# Patient Record
Sex: Male | Born: 1950 | Race: White | Hispanic: No | Marital: Married
Health system: Southern US, Community
[De-identification: ages and names within clinical notes are randomized; demographics above are authoritative.]

---

## 2015-03-28 ENCOUNTER — Inpatient Hospital Stay (HOSPITAL_COMMUNITY): Payer: Medicare Other

## 2015-03-28 ENCOUNTER — Emergency Department (HOSPITAL_COMMUNITY): Payer: Medicare Other

## 2015-03-28 ENCOUNTER — Inpatient Hospital Stay (HOSPITAL_COMMUNITY)
Admission: EM | Admit: 2015-03-28 | Discharge: 2015-04-01 | DRG: 208 | Disposition: E | Payer: Medicare Other | Attending: Pulmonary Disease | Admitting: Pulmonary Disease

## 2015-03-28 ENCOUNTER — Encounter (HOSPITAL_COMMUNITY): Payer: Self-pay

## 2015-03-28 DIAGNOSIS — I462 Cardiac arrest due to underlying cardiac condition: Secondary | ICD-10-CM | POA: Diagnosis present

## 2015-03-28 DIAGNOSIS — S225XXA Flail chest, initial encounter for closed fracture: Secondary | ICD-10-CM | POA: Diagnosis present

## 2015-03-28 DIAGNOSIS — T17928A Food in respiratory tract, part unspecified causing other injury, initial encounter: Secondary | ICD-10-CM | POA: Diagnosis present

## 2015-03-28 DIAGNOSIS — R402212 Coma scale, best verbal response, none, at arrival to emergency department: Secondary | ICD-10-CM | POA: Diagnosis present

## 2015-03-28 DIAGNOSIS — R092 Respiratory arrest: Secondary | ICD-10-CM | POA: Insufficient documentation

## 2015-03-28 DIAGNOSIS — K7200 Acute and subacute hepatic failure without coma: Secondary | ICD-10-CM | POA: Diagnosis present

## 2015-03-28 DIAGNOSIS — E874 Mixed disorder of acid-base balance: Secondary | ICD-10-CM | POA: Diagnosis present

## 2015-03-28 DIAGNOSIS — I469 Cardiac arrest, cause unspecified: Secondary | ICD-10-CM | POA: Diagnosis not present

## 2015-03-28 DIAGNOSIS — T17920A Food in respiratory tract, part unspecified causing asphyxiation, initial encounter: Secondary | ICD-10-CM | POA: Diagnosis not present

## 2015-03-28 DIAGNOSIS — G253 Myoclonus: Secondary | ICD-10-CM | POA: Diagnosis present

## 2015-03-28 DIAGNOSIS — R739 Hyperglycemia, unspecified: Secondary | ICD-10-CM | POA: Diagnosis present

## 2015-03-28 DIAGNOSIS — R402312 Coma scale, best motor response, none, at arrival to emergency department: Secondary | ICD-10-CM | POA: Diagnosis present

## 2015-03-28 DIAGNOSIS — Z515 Encounter for palliative care: Secondary | ICD-10-CM | POA: Diagnosis not present

## 2015-03-28 DIAGNOSIS — J9600 Acute respiratory failure, unspecified whether with hypoxia or hypercapnia: Secondary | ICD-10-CM | POA: Diagnosis not present

## 2015-03-28 DIAGNOSIS — N179 Acute kidney failure, unspecified: Secondary | ICD-10-CM | POA: Diagnosis present

## 2015-03-28 DIAGNOSIS — Z66 Do not resuscitate: Secondary | ICD-10-CM | POA: Diagnosis present

## 2015-03-28 DIAGNOSIS — K759 Inflammatory liver disease, unspecified: Secondary | ICD-10-CM | POA: Insufficient documentation

## 2015-03-28 DIAGNOSIS — R402112 Coma scale, eyes open, never, at arrival to emergency department: Secondary | ICD-10-CM | POA: Diagnosis not present

## 2015-03-28 DIAGNOSIS — W44F3XA Food in larynx causing asphyxiation, initial encounter: Secondary | ICD-10-CM | POA: Insufficient documentation

## 2015-03-28 DIAGNOSIS — I252 Old myocardial infarction: Secondary | ICD-10-CM | POA: Diagnosis not present

## 2015-03-28 DIAGNOSIS — G9340 Encephalopathy, unspecified: Secondary | ICD-10-CM | POA: Insufficient documentation

## 2015-03-28 DIAGNOSIS — T17320A Food in larynx causing asphyxiation, initial encounter: Secondary | ICD-10-CM

## 2015-03-28 DIAGNOSIS — I4901 Ventricular fibrillation: Secondary | ICD-10-CM | POA: Diagnosis not present

## 2015-03-28 DIAGNOSIS — G931 Anoxic brain damage, not elsewhere classified: Secondary | ICD-10-CM | POA: Diagnosis present

## 2015-03-28 LAB — COMPREHENSIVE METABOLIC PANEL
ALK PHOS: 71 U/L (ref 38–126)
ALT: 340 U/L — AB (ref 17–63)
AST: 307 U/L — AB (ref 15–41)
Albumin: 3.1 g/dL — ABNORMAL LOW (ref 3.5–5.0)
Anion gap: 18 — ABNORMAL HIGH (ref 5–15)
BILIRUBIN TOTAL: 0.7 mg/dL (ref 0.3–1.2)
BUN: 17 mg/dL (ref 6–20)
CALCIUM: 8.3 mg/dL — AB (ref 8.9–10.3)
CO2: 16 mmol/L — ABNORMAL LOW (ref 22–32)
CREATININE: 1.65 mg/dL — AB (ref 0.61–1.24)
Chloride: 107 mmol/L (ref 101–111)
GFR calc Af Amer: 48 mL/min — ABNORMAL LOW (ref >60)
GFR, EST NON AFRICAN AMERICAN: 42 mL/min — AB (ref >60)
Glucose, Bld: 275 mg/dL — ABNORMAL HIGH (ref 65–99)
Potassium: 3.3 mmol/L — ABNORMAL LOW (ref 3.5–5.1)
Sodium: 141 mmol/L (ref 135–145)
TOTAL PROTEIN: 5.2 g/dL — AB (ref 6.5–8.1)

## 2015-03-28 LAB — I-STAT ARTERIAL BLOOD GAS, ED
Acid-base deficit: 19 mmol/L — ABNORMAL HIGH (ref 0.0–2.0)
BICARBONATE: 14.2 meq/L — AB (ref 20.0–24.0)
O2 Saturation: 100 %
PCO2 ART: 67.4 mmHg — AB (ref 35.0–45.0)
Patient temperature: 98.6
TCO2: 16 mmol/L (ref 0–100)
pH, Arterial: 6.931 — CL (ref 7.350–7.450)
pO2, Arterial: 404 mmHg — ABNORMAL HIGH (ref 80.0–100.0)

## 2015-03-28 LAB — CBC
HEMATOCRIT: 46.1 % (ref 39.0–52.0)
HEMOGLOBIN: 15.2 g/dL (ref 13.0–17.0)
MCH: 31.4 pg (ref 26.0–34.0)
MCHC: 33 g/dL (ref 30.0–36.0)
MCV: 95.2 fL (ref 78.0–100.0)
Platelets: 176 10*3/uL (ref 150–400)
RBC: 4.84 MIL/uL (ref 4.22–5.81)
RDW: 13.5 % (ref 11.5–15.5)
WBC: 14.8 10*3/uL — AB (ref 4.0–10.5)

## 2015-03-28 LAB — I-STAT CHEM 8, ED
BUN: 20 mg/dL (ref 6–20)
CALCIUM ION: 1.13 mmol/L (ref 1.13–1.30)
Chloride: 105 mmol/L (ref 101–111)
Creatinine, Ser: 1.5 mg/dL — ABNORMAL HIGH (ref 0.61–1.24)
GLUCOSE: 253 mg/dL — AB (ref 65–99)
HCT: 48 % (ref 39.0–52.0)
HEMOGLOBIN: 16.3 g/dL (ref 13.0–17.0)
POTASSIUM: 3.1 mmol/L — AB (ref 3.5–5.1)
SODIUM: 141 mmol/L (ref 135–145)
TCO2: 17 mmol/L (ref 0–100)

## 2015-03-28 LAB — I-STAT TROPONIN, ED: TROPONIN I, POC: 0.28 ng/mL — AB (ref 0.00–0.08)

## 2015-03-28 LAB — CBG MONITORING, ED: Glucose-Capillary: 226 mg/dL — ABNORMAL HIGH (ref 65–99)

## 2015-03-28 LAB — PROTIME-INR
INR: 1.63 — ABNORMAL HIGH (ref 0.00–1.49)
PROTHROMBIN TIME: 19.4 s — AB (ref 11.6–15.2)

## 2015-03-28 LAB — I-STAT CG4 LACTIC ACID, ED: Lactic Acid, Venous: 10.1 mmol/L (ref 0.5–2.0)

## 2015-03-28 LAB — APTT: aPTT: 53 seconds — ABNORMAL HIGH (ref 24–37)

## 2015-03-28 MED ORDER — FENTANYL BOLUS VIA INFUSION
25.0000 ug | INTRAVENOUS | Status: DC | PRN
  Filled 2015-03-28: qty 25

## 2015-03-28 MED ORDER — FENTANYL CITRATE (PF) 100 MCG/2ML IJ SOLN
50.0000 ug | Freq: Once | INTRAMUSCULAR | Status: AC
  Administered 2015-03-28: 50 ug via INTRAVENOUS

## 2015-03-28 MED ORDER — PROPOFOL 1000 MG/100ML IV EMUL
5.0000 ug/kg/min | INTRAVENOUS | Status: DC
  Administered 2015-03-28: 10 ug/kg/min via INTRAVENOUS
  Filled 2015-03-28 (×3): qty 100

## 2015-03-28 MED ORDER — LORAZEPAM 2 MG/ML IJ SOLN
INTRAMUSCULAR | Status: AC
  Filled 2015-03-28: qty 1

## 2015-03-28 MED ORDER — SODIUM CHLORIDE 0.9 % IV SOLN
1.0000 ug/kg/min | INTRAVENOUS | Status: DC
  Administered 2015-03-29: 1 ug/kg/min via INTRAVENOUS
  Filled 2015-03-28: qty 20

## 2015-03-28 MED ORDER — NOREPINEPHRINE BITARTRATE 1 MG/ML IV SOLN
0.0000 ug/min | INTRAVENOUS | Status: DC
  Administered 2015-03-29: 0 ug/min via INTRAVENOUS
  Filled 2015-03-28: qty 4

## 2015-03-28 MED ORDER — ARTIFICIAL TEARS OP OINT
1.0000 "application " | TOPICAL_OINTMENT | Freq: Three times a day (TID) | OPHTHALMIC | Status: DC
  Administered 2015-03-29 (×2): 1 via OPHTHALMIC
  Filled 2015-03-28: qty 3.5

## 2015-03-28 MED ORDER — FAMOTIDINE IN NACL 20-0.9 MG/50ML-% IV SOLN
20.0000 mg | Freq: Two times a day (BID) | INTRAVENOUS | Status: DC
  Administered 2015-03-29 (×2): 20 mg via INTRAVENOUS
  Filled 2015-03-28 (×2): qty 50

## 2015-03-28 MED ORDER — HEPARIN SODIUM (PORCINE) 5000 UNIT/ML IJ SOLN
5000.0000 [IU] | Freq: Three times a day (TID) | INTRAMUSCULAR | Status: DC
  Administered 2015-03-29 (×2): 5000 [IU] via SUBCUTANEOUS
  Filled 2015-03-28 (×2): qty 1

## 2015-03-28 MED ORDER — CISATRACURIUM BOLUS VIA INFUSION
0.1000 mg/kg | Freq: Once | INTRAVENOUS | Status: AC
  Administered 2015-03-29: 7.7 mg via INTRAVENOUS
  Filled 2015-03-28: qty 8

## 2015-03-28 MED ORDER — SODIUM CHLORIDE 0.9 % IV SOLN
2000.0000 mL | Freq: Once | INTRAVENOUS | Status: AC
  Administered 2015-03-28: 2000 mL via INTRAVENOUS

## 2015-03-28 MED ORDER — ASPIRIN 300 MG RE SUPP
300.0000 mg | RECTAL | Status: AC
  Administered 2015-03-28: 300 mg via RECTAL
  Filled 2015-03-28: qty 1

## 2015-03-28 MED ORDER — SODIUM CHLORIDE 0.9 % IV BOLUS (SEPSIS)
1000.0000 mL | Freq: Once | INTRAVENOUS | Status: AC
  Administered 2015-03-28: 1000 mL via INTRAVENOUS

## 2015-03-28 MED ORDER — CISATRACURIUM BOLUS VIA INFUSION
0.0500 mg/kg | INTRAVENOUS | Status: DC | PRN
  Filled 2015-03-28: qty 4

## 2015-03-28 MED ORDER — SODIUM CHLORIDE 0.9 % IV SOLN
25.0000 ug/h | INTRAVENOUS | Status: DC
  Administered 2015-03-28: 50 ug/h via INTRAVENOUS
  Filled 2015-03-28 (×2): qty 50

## 2015-03-28 MED ORDER — LORAZEPAM 2 MG/ML IJ SOLN
1.0000 mg | Freq: Once | INTRAMUSCULAR | Status: AC
  Administered 2015-03-28: 1 mg via INTRAVENOUS

## 2015-03-28 MED ORDER — ROCURONIUM BROMIDE 50 MG/5ML IV SOLN
50.0000 mg/kg | Freq: Once | INTRAVENOUS | Status: AC
  Administered 2015-03-28: 50 mg via INTRAVENOUS

## 2015-03-28 MED ORDER — POTASSIUM CHLORIDE 10 MEQ/50ML IV SOLN
10.0000 meq | INTRAVENOUS | Status: AC
  Administered 2015-03-29 (×4): 10 meq via INTRAVENOUS
  Filled 2015-03-28 (×4): qty 50

## 2015-03-28 MED ORDER — LORAZEPAM 2 MG/ML IJ SOLN
2.0000 mg | Freq: Once | INTRAMUSCULAR | Status: AC
  Administered 2015-03-28: 2 mg via INTRAVENOUS

## 2015-03-28 MED ORDER — PROPOFOL 10 MG/ML IV BOLUS
50.0000 mg | Freq: Once | INTRAVENOUS | Status: AC
  Administered 2015-03-28: 50 mg via INTRAVENOUS

## 2015-03-28 NOTE — ED Provider Notes (Signed)
CSN: [id number]     Arrival date & time 03/05/2015  2035 History   First MD Initiated Contact with Patient 03/05/2015 2048     Chief Complaint  Patient presents with  . Cardiac Arrest   (Consider location/radiation/quality/duration/timing/severity/associated sxs/prior Treatment) Patient is a 65 y.o. male presenting with general illness. The history is provided by the EMS personnel and a friend. The history is limited by the condition of the patient.  Illness Location:  Cardiac arrest after choking event  Quality:  Cpr intermittently  Severity:  Severe Onset quality:  Sudden Duration: went unresponsive around 1900. Timing:  Constant Progression:  Unchanged Chronicity:  New Context:  No known medical history Relieved by:  N/a Worsened by:  N/a Ineffective treatments:  N/a level 5 caveat applies 2/2 cardiac arrest  History reviewed. No pertinent past medical history. History reviewed. No pertinent past surgical history. No family history on file. Social History  Substance Use Topics  . Smoking status: Unknown If Ever Smoked  . Smokeless tobacco: None  . Alcohol Use: None    Review of Systems  Unable to perform ROS: Patient unresponsive      Allergies  Review of patient's allergies indicates no known allergies.  Home Medications   Prior to Admission medications   Not on File   BP 87/64 mmHg  Pulse 82  Temp(Src) 98 F (36.7 C) (Rectal)  Resp 19  Wt 77.111 kg  SpO2 94% Physical Exam  Constitutional: He is uncooperative. He is intubated.  HENT:  Head: Normocephalic and atraumatic.  Eyes:  Pupils unreactive but equal  Neck: Neck supple.  Cardiovascular: Normal rate, regular rhythm, normal heart sounds and intact distal pulses.   Pulmonary/Chest: He is intubated.  Mechanical breath sounds bilaterally. Patient does appear to have flail chest.  Abdominal: Soft. He exhibits no distension. There is no rebound and no guarding.  Musculoskeletal: He exhibits no edema.   Neurological: He is unresponsive. He exhibits normal muscle tone. GCS eye subscore is 1. GCS verbal subscore is 1. GCS motor subscore is 1.  Patient appears to have myoclonic jerking at irregular intervals.  Skin: No rash noted. No erythema. No pallor.    ED Course  Procedures (including critical care time) Labs Review Labs Reviewed  APTT - Abnormal; Notable for the following:    aPTT 53 (*)    All other components within normal limits  CBC - Abnormal; Notable for the following:    WBC 14.8 (*)    All other components within normal limits  COMPREHENSIVE METABOLIC PANEL - Abnormal; Notable for the following:    Potassium 3.3 (*)    CO2 16 (*)    Glucose, Bld 275 (*)    Creatinine, Ser 1.65 (*)    Calcium 8.3 (*)    Total Protein 5.2 (*)    Albumin 3.1 (*)    AST 307 (*)    ALT 340 (*)    GFR calc non Af Amer 42 (*)    GFR calc Af Amer 48 (*)    Anion gap 18 (*)    All other components within normal limits  PROTIME-INR - Abnormal; Notable for the following:    Prothrombin Time 19.4 (*)    INR 1.63 (*)    All other components within normal limits  I-STAT CHEM 8, ED - Abnormal; Notable for the following:    Potassium 3.1 (*)    Creatinine, Ser 1.50 (*)    Glucose, Bld 253 (*)    All other  components within normal limits  I-STAT TROPOININ, ED - Abnormal; Notable for the following:    Troponin i, poc 0.28 (*)    All other components within normal limits  CBG MONITORING, ED - Abnormal; Notable for the following:    Glucose-Capillary 226 (*)    All other components within normal limits  I-STAT ARTERIAL BLOOD GAS, ED - Abnormal; Notable for the following:    pH, Arterial 6.931 (*)    pCO2 arterial 67.4 (*)    pO2, Arterial 404.0 (*)    Bicarbonate 14.2 (*)    Acid-base deficit 19.0 (*)    All other components within normal limits  I-STAT CG4 LACTIC ACID, ED - Abnormal; Notable for the following:    Lactic Acid, Venous 10.10 (*)    All other components within normal  limits  CULTURE, BLOOD (ROUTINE X 2)  CULTURE, BLOOD (ROUTINE X 2)  BLOOD GAS, ARTERIAL  TROPONIN I  TROPONIN I  TROPONIN I  BASIC METABOLIC PANEL  BASIC METABOLIC PANEL  BASIC METABOLIC PANEL  BASIC METABOLIC PANEL  PROTIME-INR  PROTIME-INR  APTT  APTT  BLOOD GAS, ARTERIAL  BLOOD GAS, ARTERIAL  BLOOD GAS, ARTERIAL  CBC  CBC  BASIC METABOLIC PANEL  LACTIC ACID, PLASMA  LACTIC ACID, PLASMA  MAGNESIUM  MAGNESIUM  MAGNESIUM  MAGNESIUM  MAGNESIUM  PHOSPHORUS  PHOSPHORUS  PHOSPHORUS  PHOSPHORUS  PHOSPHORUS  LIPASE, BLOOD  ACETAMINOPHEN LEVEL  DRUG PROFILE, UR, 9 DRUGS  SALICYLATE LEVEL  ETHANOL  I-STAT TROPOININ, ED    Imaging Review Dg Chest Port 1 View  2015-04-01  CLINICAL DATA:  Cardiorespiratory arrest. Intubation. Repositioning of OG tube. Central venous catheter placement. EXAM: PORTABLE CHEST 1 VIEW 2257 hr: COMPARISON:  Portable chest x-Searcy earlier same day 2058 hr. FINDINGS: Suboptimal inspiration. Cardiac silhouette upper normal in size to slightly enlarged for technique and degree of inspiration. Interval development of mild interstitial and airspace opacities in both lungs, right greater than left, since the earlier examination. Endotracheal tube tip projects approximately 2 cm above the carina. Left subclavian central venous catheter tip projects over the mid SVC. No evidence of pneumothorax or mediastinal hematoma. OG tube tip in the fundus of the stomach. External pacing pads. IMPRESSION: 1. Support apparatus satisfactory. New left subclavian central venous catheter tip projects over the mid SVC. No acute complicating features. OG tube tip now in the fundus of the stomach. 2. Developing mild CHF and/or fluid overload. Electronically Signed   By: Hulan Saas M.D.   On: 2015/04/01 23:15   Dg Chest Port 1 View  04/01/15  CLINICAL DATA:  Endotracheal tube placement. Patient choked on a piece of steak and became unresponsive. EXAM: PORTABLE CHEST 1 VIEW  COMPARISON:  None. FINDINGS: Endotracheal tube placed with tip measuring 2.9 cm above the carina. There is an enteric tube present. The tip is in the region of the upper esophagus at the level of the clavicles. Advancement is suggested. Shallow inspiration. Heart size and pulmonary vascularity are normal for technique. No focal consolidation. No pneumothorax. IMPRESSION: Endotracheal tube appears in satisfactory position. Enteric tube tip is in the upper esophagus at the level of clavicles and needs to be advanced. Electronically Signed   By: Burman Nieves M.D.   On: 01-Apr-2015 21:15   I have personally reviewed and evaluated these images and lab results as part of my medical decision-making.   EKG Interpretation   Date/Time:  Saturday 04-01-2015 20:45:08 EST Ventricular Rate:  91 PR Interval:  198  QRS Duration: 96 QT Interval:  387 QTC Calculation: 476 R Axis:   7 Text Interpretation:  Sinus rhythm Low voltage, extremity leads Minimal ST  depression, lateral leads Borderline prolonged QT interval Confirmed by  Jodi Mourning  MD, JOSHUA ([id number]) on 04/02/2015 8:57:01 PM      MDM   Final diagnoses:  Respiratory arrest (HCC)  Choking due to food in larynx, initial encounter    Patient is a 65 year old male who presents post arrest via EMS. It is reported that he started choking on a piece of steak and then his girlfriend perform the Heimlich maneuver and started CPR when he became unresponsive. With EMS patient lost pulses multiple times. He had PEA arrest, asystole, and Vfib, he received multiple doses of epi as well as defibrillation. Patient was intubated by ems. Upon arrival patient is intubated and mechanical respirations and on epi drip. Further history and exam as above. Patient will be cooled per request of critical care. Central line placed. Patient will be admitted to the ICU for further management and evaluation.  Marijean Niemann, MD 03/21/2015 1114  Blane Ohara, MD 03/18/2015  2108

## 2015-03-28 NOTE — ED Notes (Signed)
CBG 226. RN notified. 

## 2015-03-28 NOTE — Progress Notes (Signed)
   03/02/2015 2300  Clinical Encounter Type  Visited With Family  Visit Type ED  Referral From Nurse  Spiritual Encounters  Spiritual Needs Emotional;Grief support  Stress Factors  Family Stress Factors Lack of knowledge;Major life changes  Patient brought in after apparently choking on food and having a heart attack. Chaplain located family and took them to consult, then facilitated updates for family and assisted doctor with locating wife. Provided hospitality and took family to [facility] waiting area. Family was waiting for estranged wife to come to make decisions regarding care. Chaplain will check in to see if she has arrived.

## 2015-03-28 NOTE — ED Notes (Signed)
Attempted report 

## 2015-03-28 NOTE — Procedures (Signed)
CENTRAL VENOUS CATHETER INSERTION   Indication: Access Consent: yes Time out: yes Appropriate position: yes Hand washing: yes Patient Sterilized and Draped: yes Location: Left IJ # of Attempts: 2 Ultrasound Guidance: yes Wire Confirmed with [place]: yes Insertion depth: 20 cm All Ports Draw and flush: yes CXR:   Pneumothorax: no  Line position appropriate: yes Line sutured in place: yes EBL: 5 cc Complications: no Patient Tolerated Procedure Well: yes     Galvin Proffer, DO., MS [place] [facility]

## 2015-03-28 NOTE — ED Notes (Signed)
Dr. Eugenie Norrie called wife to leave a messgae. Pt's girlfriend is currently here with pt.

## 2015-03-28 NOTE — Progress Notes (Signed)
Pt transported to CT and 2H without event.

## 2015-03-28 NOTE — ED Notes (Addendum)
Per Dr. Marton Redwood verde cooling pt to 33 degrees Celsius

## 2015-03-28 NOTE — ED Notes (Addendum)
CDS contacted and spoke with Nada Boozer. CDS reference number [id number], she is sending pt information to Lyn[facility] Poser for donor.

## 2015-03-28 NOTE — ED Notes (Addendum)
Per [facility], pt was at home eating and started choking on a piece of steak. Went unresponsive and family started CPR at 17. EMS arrived and took over. Pt lost pulses multiple times and was shocked for VFIB x1 early during code. Intubated with 7.0 ETT. Last time he lost pulses was 2018, given 2 rounds of CPR and 1 epi and pulses regained at 2029. Each time they lost pulses they would get them back with a few rounds of CPR and an epi. Per EMS, pt received 2 bags of NS.

## 2015-03-28 NOTE — ED Notes (Addendum)
Pt's wife, Nikolos Billig, called back and is speaking with Dr. Donnamae Jude and this RN confirming via telephone that pt will be a DNR

## 2015-03-28 NOTE — Progress Notes (Signed)
ABG drawn and uploaded to system.  MD notified of results.  Follow up abg to come.

## 2015-03-28 NOTE — H&P (Addendum)
PULMONARY / CRITICAL CARE MEDICINE   Name: Jimmy Meyers MRN: [id number] DOB: December 02, 1950    ADMISSION DATE:  03/05/2015  CHIEF COMPLAINT:  Cardiac arrest  HISTORY OF PRESENT ILLNESS:   64 CM with only history of heart attack 30 years ago.  He had not been to a physician in many years so no other known medical history.  Per report the patient told his friend that he was worried he had a "heart attack or stroke" because he was having weakness in his right arm over the last few days.   [id number] today he choked on a piece of steak, and became unresponsive.  His girlfriend administered the heimlich maneuver      PAST MEDICAL HISTORY :   has no past medical history on file.  has no past surgical history on file. Prior to Admission medications   Not on File   No Known Allergies  FAMILY HISTORY:  has no family status information on file.  SOCIAL HISTORY:    REVIEW OF SYSTEMS:  Unable to obtain  SUBJECTIVE:   VITAL SIGNS: Temp:  [93.9 F (34.4 C)] 93.9 F (34.4 C) (01/28 2053) Pulse Rate:  [84-99] 90 (01/28 2145) Resp:  [17-32] 19 (01/28 2145) BP: (92-168)/(50-70) 105/65 mmHg (01/28 2145) SpO2:  [92 %-100 %] 92 % (01/28 2145) FiO2 (%):  [40 %-100 %] 40 % (01/28 2102) Weight:  [77.111 kg (170 lb)] 77.111 kg (170 lb) (01/28 2044) HEMODYNAMICS:   VENTILATOR SETTINGS: Vent Mode:  [-] PRVC FiO2 (%):  [40 %-100 %] 40 % Set Rate:  [24 bmp] 24 bmp Vt Set:  [696 mL] 620 mL PEEP:  [5 cmH20] 5 cmH20 Plateau Pressure:  [21 cmH20] 21 cmH20 INTAKE / OUTPUT: No intake or output data in the 24 hours ending 03/10/2015 2156  PHYSICAL EXAMINATION: General:  Obtunded, not sedated Neuro:   Diffuse myoclonic jerks, No gag, no cough, no corneal reflex.  No withdrawal to proximal  Noxious stimuli.  HEENT:  NCAT,  Pupils 4mm fixed Cardiovascular:  RRR, no m/r/g.  Paradoxical anterior chest wall movement with + flail chest Lungs:  CTA b/l no w/r/r. Abdomen:  Soft non distended.  No masses.  No bowel sounds Musculoskeletal:  + Flail chest Skin:  No abrasions no rashes  LABS:  CBC  Recent Labs Lab 03/15/2015 2042 03/14/2015 2059  WBC 14.8*  --   HGB 15.2 16.3  HCT 46.1 48.0  PLT 176  --    Coag's  Recent Labs Lab 03/10/2015 2042  APTT 53*  INR 1.63*   BMET  Recent Labs Lab 03/02/2015 2059  NA 141  K 3.1*  CL 105  BUN 20  CREATININE 1.50*  GLUCOSE 253*   Electrolytes No results for input(s): CALCIUM, MG, PHOS in the last 168 hours. Sepsis Markers  Recent Labs Lab 03/27/2015 2059  LATICACIDVEN 10.10*   ABG  Recent Labs Lab 03/06/2015 2049  PHART 6.931*  PCO2ART 67.4*  PO2ART 404.0*   Liver Enzymes No results for input(s): AST, ALT, ALKPHOS, BILITOT, ALBUMIN in the last 168 hours. Cardiac Enzymes No results for input(s): TROPONINI, PROBNP in the last 168 hours. Glucose  Recent Labs Lab 03/10/2015 2047  GLUCAP 226*    Imaging Dg Chest Port 1 View  03/17/2015  CLINICAL DATA:  Endotracheal tube placement. Patient choked on a piece of steak and became unresponsive. EXAM: PORTABLE CHEST 1 VIEW COMPARISON:  None. FINDINGS: Endotracheal tube placed with tip measuring 2.9 cm above the carina. There is an  enteric tube present. The tip is in the region of the upper esophagus at the level of the clavicles. Advancement is suggested. Shallow inspiration. Heart size and pulmonary vascularity are normal for technique. No focal consolidation. No pneumothorax. IMPRESSION: Endotracheal tube appears in satisfactory position. Enteric tube tip is in the upper esophagus at the level of clavicles and needs to be advanced. Electronically Signed   By: Burman Nieves M.D.   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  PULMONARY A: Intubated for GCS 3, airway protection P:   - CXR - ABG - maintain O2 Sat >88% - Propofol and fentanyl for  sedation - Oral hygiene  - PPI - Flail chest,   CARDIOVASCULAR A: S/p out of hospital Vfib arrest with approx of total CPR time. P:  - therapeutic hypothermia protocol 33 deg - trend troponins - EKG x2 - BMP, mag, phos now then Q3hrs until goal temp achieved  - NMB for myoclonic jerks if needed to maintain goal temp - CVC inserted  RENAL A:  AGMA 2/2 elevated lactate from cardiac arrest. Unclear baseline Scr P:   - Fluid resucitation - trend lactate - currently with adequate UOP  - no e/o ARF at this time  GASTROINTESTINAL A:  Transamonitis, likely early shock liver P:   - trend LFT - RUQ [place] with doppler - APAP LEVEL - ASA LEVEL - TOX SCREEN - Etoh level  HEMATOLOGIC A:  Leukocytosis likely stress resoponse P:  - Pan cx  INFECTIOUS A:  No e/o sepsis at this time. Holding Abx.  If WBC cont to rise start CTX/Azithro as pt will not fever. P:   BCx2 03/09/2015  ENDOCRINE A:  Hyperglycemia without dx of DM   P:   - SSI  NEUROLOGIC A:  Diffuse anoxic brain injury with of total down time s/p 3 arrests.  Catastrophic neuro exam at present time.  Though prior to 48 hours, unable to prognosticate P:   RASS goal: -4 Deep sedation for hypothermic protocol   FAMILY  - Updates: Patient has girlfriend (Cici) with whom he lives.  He is also still married to [place] [phone]).  The family dynamic is stable and functional.  I have spoken with both women, his wife will remain MDPOA at this time (Cici is in agreement with this).  I have updated the family and his wife is on the way.  Cici is at bedside.   Total critical care time: 60 min  Critical care time was exclusive of separately billable procedures and treating other patients.  Critical care was necessary to treat or prevent imminent or life-threatening deterioration.  Critical care was time spent personally by me on the following activities: development of treatment plan with patient and/or  surrogate as well as nursing, discussions with consultants, evaluation of patient's response to treatment, examination of patient, obtaining history from patient or surrogate, ordering and performing treatments and interventions, ordering and review of laboratory studies, ordering and review of radiographic studies, pulse oximetry and re-evaluation of patient's condition.   Galvin Proffer, DO., MS [facility] and Critical Care Medicine   Pulmonary and Critical Care Medicine [facility] Pager: [phone]  03/07/2015, 9:56 PM

## 2015-03-29 DIAGNOSIS — I469 Cardiac arrest, cause unspecified: Secondary | ICD-10-CM

## 2015-03-29 DIAGNOSIS — R092 Respiratory arrest: Secondary | ICD-10-CM | POA: Insufficient documentation

## 2015-03-29 DIAGNOSIS — T17320A Food in larynx causing asphyxiation, initial encounter: Secondary | ICD-10-CM | POA: Insufficient documentation

## 2015-03-29 DIAGNOSIS — K759 Inflammatory liver disease, unspecified: Secondary | ICD-10-CM | POA: Insufficient documentation

## 2015-03-29 DIAGNOSIS — G9340 Encephalopathy, unspecified: Secondary | ICD-10-CM

## 2015-03-29 LAB — BASIC METABOLIC PANEL
ANION GAP: 12 (ref 5–15)
ANION GAP: 10 (ref 5–15)
ANION GAP: 11 (ref 5–15)
Anion gap: 13 (ref 5–15)
Anion gap: 13 (ref 5–15)
BUN: 18 mg/dL (ref 6–20)
BUN: 19 mg/dL (ref 6–20)
BUN: 19 mg/dL (ref 6–20)
BUN: 21 mg/dL — ABNORMAL HIGH (ref 6–20)
BUN: 21 mg/dL — AB (ref 6–20)
CALCIUM: 7.1 mg/dL — AB (ref 8.9–10.3)
CALCIUM: 7.3 mg/dL — AB (ref 8.9–10.3)
CALCIUM: 7.4 mg/dL — AB (ref 8.9–10.3)
CALCIUM: 7.6 mg/dL — AB (ref 8.9–10.3)
CHLORIDE: 112 mmol/L — AB (ref 101–111)
CHLORIDE: 112 mmol/L — AB (ref 101–111)
CHLORIDE: 111 mmol/L (ref 101–111)
CHLORIDE: 112 mmol/L — AB (ref 101–111)
CO2: 14 mmol/L — AB (ref 22–32)
CO2: 15 mmol/L — AB (ref 22–32)
CO2: 17 mmol/L — AB (ref 22–32)
CO2: 17 mmol/L — AB (ref 22–32)
CO2: 18 mmol/L — AB (ref 22–32)
CREATININE: 1.3 mg/dL — AB (ref 0.61–1.24)
CREATININE: 1.36 mg/dL — AB (ref 0.61–1.24)
CREATININE: 1.46 mg/dL — AB (ref 0.61–1.24)
Calcium: 7.4 mg/dL — ABNORMAL LOW (ref 8.9–10.3)
Chloride: 111 mmol/L (ref 101–111)
Creatinine, Ser: 1.39 mg/dL — ABNORMAL HIGH (ref 0.61–1.24)
Creatinine, Ser: 1.32 mg/dL — ABNORMAL HIGH (ref 0.61–1.24)
GFR calc Af Amer: 56 mL/min — ABNORMAL LOW (ref >60)
GFR calc Af Amer: >60 mL/min (ref >60)
GFR calc Af Amer: >60 mL/min (ref >60)
GFR calc Af Amer: 59 mL/min — ABNORMAL LOW (ref >60)
GFR calc Af Amer: >60 mL/min (ref >60)
GFR calc non Af Amer: 48 mL/min — ABNORMAL LOW (ref >60)
GFR calc non Af Amer: 53 mL/min — ABNORMAL LOW (ref >60)
GFR calc non Af Amer: 56 mL/min — ABNORMAL LOW (ref >60)
GFR calc non Af Amer: 55 mL/min — ABNORMAL LOW (ref >60)
GFR, EST NON AFRICAN AMERICAN: 51 mL/min — AB (ref >60)
GLUCOSE: 172 mg/dL — AB (ref 65–99)
GLUCOSE: 185 mg/dL — AB (ref 65–99)
GLUCOSE: 187 mg/dL — AB (ref 65–99)
GLUCOSE: 191 mg/dL — AB (ref 65–99)
GLUCOSE: 192 mg/dL — AB (ref 65–99)
POTASSIUM: 4.1 mmol/L (ref 3.5–5.1)
Potassium: 3.7 mmol/L (ref 3.5–5.1)
Potassium: 4.2 mmol/L (ref 3.5–5.1)
Potassium: 3.9 mmol/L (ref 3.5–5.1)
Potassium: 3.8 mmol/L (ref 3.5–5.1)
Sodium: 138 mmol/L (ref 135–145)
Sodium: 140 mmol/L (ref 135–145)
Sodium: 142 mmol/L (ref 135–145)
Sodium: 138 mmol/L (ref 135–145)
Sodium: 140 mmol/L (ref 135–145)

## 2015-03-29 LAB — TROPONIN I
TROPONIN I: 1.49 ng/mL — AB (ref <0.031)
Troponin I: 2.94 ng/mL (ref <0.031)

## 2015-03-29 LAB — GLUCOSE, CAPILLARY
GLUCOSE-CAPILLARY: 155 mg/dL — AB (ref 65–99)
GLUCOSE-CAPILLARY: 156 mg/dL — AB (ref 65–99)
GLUCOSE-CAPILLARY: 166 mg/dL — AB (ref 65–99)
GLUCOSE-CAPILLARY: 168 mg/dL — AB (ref 65–99)
GLUCOSE-CAPILLARY: 171 mg/dL — AB (ref 65–99)
Glucose-Capillary: 152 mg/dL — ABNORMAL HIGH (ref 65–99)
Glucose-Capillary: 160 mg/dL — ABNORMAL HIGH (ref 65–99)
Glucose-Capillary: 155 mg/dL — ABNORMAL HIGH (ref 65–99)
Glucose-Capillary: 150 mg/dL — ABNORMAL HIGH (ref 65–99)

## 2015-03-29 LAB — LACTIC ACID, PLASMA
Lactic Acid, Venous: 6.4 mmol/L (ref 0.5–2.0)
Lactic Acid, Venous: 2.5 mmol/L (ref 0.5–2.0)

## 2015-03-29 LAB — MRSA PCR SCREENING: MRSA by PCR: NEGATIVE

## 2015-03-29 LAB — CBC
HEMATOCRIT: 44.8 % (ref 39.0–52.0)
HEMATOCRIT: 45.2 % (ref 39.0–52.0)
HEMOGLOBIN: 15.2 g/dL (ref 13.0–17.0)
HEMOGLOBIN: 15.5 g/dL (ref 13.0–17.0)
MCH: 31.6 pg (ref 26.0–34.0)
MCH: 31.3 pg (ref 26.0–34.0)
MCHC: 34.3 g/dL (ref 30.0–36.0)
MCHC: 33.9 g/dL (ref 30.0–36.0)
MCV: 93.1 fL (ref 78.0–100.0)
MCV: 91.1 fL (ref 78.0–100.0)
Platelets: 169 10*3/uL (ref 150–400)
Platelets: 145 10*3/uL — ABNORMAL LOW (ref 150–400)
RBC: 4.81 MIL/uL (ref 4.22–5.81)
RBC: 4.96 MIL/uL (ref 4.22–5.81)
RDW: 13.6 % (ref 11.5–15.5)
RDW: 13.7 % (ref 11.5–15.5)
WBC: 6.3 10*3/uL (ref 4.0–10.5)
WBC: 3.5 10*3/uL — AB (ref 4.0–10.5)

## 2015-03-29 LAB — SALICYLATE LEVEL: Salicylate Lvl: <4 mg/dL (ref 2.8–30.0)

## 2015-03-29 LAB — POCT I-STAT 3, ART BLOOD GAS (G3+)
ACID-BASE DEFICIT: 19 mmol/L — AB (ref 0.0–2.0)
Acid-base deficit: 17 mmol/L — ABNORMAL HIGH (ref 0.0–2.0)
BICARBONATE: 11.7 meq/L — AB (ref 20.0–24.0)
Bicarbonate: 11.9 mEq/L — ABNORMAL LOW (ref 20.0–24.0)
O2 SAT: 93 %
O2 Saturation: 93 %
PCO2 ART: 32.2 mmHg — AB (ref 35.0–45.0)
PCO2 ART: 36.2 mmHg (ref 35.0–45.0)
PO2 ART: 71 mmHg — AB (ref 80.0–100.0)
PO2 ART: 73 mmHg — AB (ref 80.0–100.0)
Patient temperature: 33
Patient temperature: 33.2
TCO2: 13 mmol/L (ref 0–100)
TCO2: 13 mmol/L (ref 0–100)
pH, Arterial: 7.09 — CL (ref 7.350–7.450)
pH, Arterial: 7.153 — CL (ref 7.350–7.450)

## 2015-03-29 LAB — PHOSPHORUS
PHOSPHORUS: 4.5 mg/dL (ref 2.5–4.6)
PHOSPHORUS: 6.4 mg/dL — AB (ref 2.5–4.6)
Phosphorus: 3.9 mg/dL (ref 2.5–4.6)
Phosphorus: 2.5 mg/dL (ref 2.5–4.6)

## 2015-03-29 LAB — PROTIME-INR
INR: 1.58 — AB (ref 0.00–1.49)
Prothrombin Time: 18.9 seconds — ABNORMAL HIGH (ref 11.6–15.2)

## 2015-03-29 LAB — LIPASE, BLOOD: Lipase: 46 U/L (ref 11–51)

## 2015-03-29 LAB — MAGNESIUM
MAGNESIUM: 1.4 mg/dL — AB (ref 1.7–2.4)
MAGNESIUM: 1.5 mg/dL — AB (ref 1.7–2.4)
MAGNESIUM: 1.7 mg/dL (ref 1.7–2.4)
Magnesium: 2.9 mg/dL — ABNORMAL HIGH (ref 1.7–2.4)

## 2015-03-29 LAB — ACETAMINOPHEN LEVEL: Acetaminophen (Tylenol), Serum: <10 ug/mL — ABNORMAL LOW (ref 10–30)

## 2015-03-29 LAB — APTT: APTT: 30 s (ref 24–37)

## 2015-03-29 LAB — ETHANOL: Alcohol, Ethyl (B): 37 mg/dL — ABNORMAL HIGH (ref <5)

## 2015-03-29 MED ORDER — ANTISEPTIC ORAL RINSE SOLUTION (CORINZ)
7.0000 mL | OROMUCOSAL | Status: DC
  Administered 2015-03-29 (×5): 7 mL via OROMUCOSAL

## 2015-03-29 MED ORDER — LORAZEPAM 2 MG/ML IJ SOLN: 2.0000 mg | INTRAMUSCULAR | Status: DC

## 2015-03-29 MED ORDER — MAGNESIUM SULFATE 4 GM/100ML IV SOLN
4.0000 g | Freq: Once | INTRAVENOUS | Status: AC
  Administered 2015-03-29: 4 g via INTRAVENOUS
  Filled 2015-03-29: qty 100

## 2015-03-29 MED ORDER — MORPHINE SULFATE 25 MG/ML IV SOLN
10.0000 mg/h | INTRAVENOUS | Status: DC
  Administered 2015-03-29: 10 mg/h via INTRAVENOUS
  Filled 2015-03-29: qty 10

## 2015-03-29 MED ORDER — LORAZEPAM BOLUS VIA INFUSION
2.0000 mg | INTRAVENOUS | Status: DC | PRN
  Filled 2015-03-29: qty 5

## 2015-03-29 MED ORDER — SODIUM CHLORIDE 0.9 % IV SOLN
INTRAVENOUS | Status: DC
  Administered 2015-03-29: 08:00:00 via INTRAVENOUS

## 2015-03-29 MED ORDER — LORAZEPAM 2 MG/ML IJ SOLN
5.0000 mg/h | INTRAVENOUS | Status: DC
  Administered 2015-03-29: 5 mg/h via INTRAVENOUS
  Filled 2015-03-29 (×2): qty 25

## 2015-03-29 MED ORDER — CHLORHEXIDINE GLUCONATE 0.12% ORAL RINSE (MEDLINE KIT)
15.0000 mL | Freq: Two times a day (BID) | OROMUCOSAL | Status: DC
  Administered 2015-03-29 (×2): 15 mL via OROMUCOSAL

## 2015-03-29 MED ORDER — INSULIN ASPART 100 UNIT/ML ~~LOC~~ SOLN
0.0000 [IU] | SUBCUTANEOUS | Status: DC
  Administered 2015-03-29 (×2): 3 [IU] via SUBCUTANEOUS

## 2015-03-29 MED ORDER — LORAZEPAM 2 MG/ML IJ SOLN
2.0000 mg | INTRAMUSCULAR | Status: DC | PRN
  Administered 2015-03-29: 2 mg via INTRAVENOUS
  Filled 2015-03-29: qty 1

## 2015-03-29 MED ORDER — MORPHINE BOLUS VIA INFUSION
5.0000 mg | INTRAVENOUS | Status: DC | PRN
  Filled 2015-03-29: qty 20

## 2015-03-29 MED ORDER — SODIUM BICARBONATE 8.4 % IV SOLN
100.0000 meq | Freq: Once | INTRAVENOUS | Status: AC
  Administered 2015-03-29: 100 meq via INTRAVENOUS
  Filled 2015-03-29: qty 100

## 2015-03-31 LAB — DRUG PROFILE, UR, 9 DRUGS (LABCORP)
Amphetamines, Urine: NEGATIVE ng/mL
BENZODIAZEPINE QUANT UR: NEGATIVE ng/mL
Barbiturate, Ur: NEGATIVE ng/mL
CANNABINOID QUANT UR: NEGATIVE ng/mL
Cocaine (Metab.): NEGATIVE ng/mL
Methadone Screen, Urine: NEGATIVE ng/mL
Opiate Quant, Ur: NEGATIVE ng/mL
PHENCYCLIDINE, UR: NEGATIVE ng/mL
Propoxyphene, Urine: NEGATIVE ng/mL

## 2015-04-01 NOTE — Progress Notes (Signed)
 Nimbex gtt, 50mL Fentanyl gtt, and 200 Morphine gtt wasted in sink with Dub Amis, RN.

## 2015-04-01 NOTE — Progress Notes (Signed)
eLink Physician-Brief Progress Note Patient Name: Jimmy Meyers DOB: [birth year] MRN: [id number]   Date of Service  03/06/2015  HPI/Events of Note  Ongoing metabolic/lactic acidosis with pH 7.15/32/73/11.    eICU Interventions  No change in vent 2 amps of bicarb IVP Repeat ABG later     Intervention Category Major Interventions: Acid-Base disturbance - evaluation and management  DETERDING,ELIZABETH 03/18/2015, 2:26 AM

## 2015-04-01 NOTE — Progress Notes (Signed)
eLink Physician-Brief Progress Note Patient Name: Jimmy Meyers DOB: [birth year] MRN: [id number]   Date of Service  04-13-15  HPI/Events of Note  Hyperglycemia  eICU Interventions  Placed on q4 hour SSI - moderate scale     Intervention Category Intermediate Interventions: Hyperglycemia - evaluation and treatment  DETERDING,ELIZABETH 2015-04-13, 2:13 AM

## 2015-04-01 NOTE — Progress Notes (Signed)
CRITICAL VALUE ALERT  Critical value received: Troponin 1.49  Date of notification:  03/17/2015  Time of notification:  0225  Critical value read back:yes  Nurse who received alert:  Marshia Ly  MD notified (1st page):  Dr. Loney Loh  Time of first page:  [phone]

## 2015-04-01 NOTE — Progress Notes (Signed)
Pt noted to be asystole on monitor, absence of spontaneous respirations. No heart/breath sounds on auscultation. Confirmed with Dub Amis, RN.

## 2015-04-01 NOTE — Progress Notes (Signed)
eLink Physician-Brief Progress Note Patient Name: Jimmy Meyers DOB: 1951/02/04 MRN: [id number]   Date of Service  2015-04-21  HPI/Events of Note  Hypomag  eICU Interventions  Mag replaced     Intervention Category Intermediate Interventions: Electrolyte abnormality - evaluation and management  [name] 04/21/2015, 3:58 AM

## 2015-04-01 NOTE — H&P (Addendum)
PULMONARY / CRITICAL CARE MEDICINE   Name: Jimmy Meyers MRN: [id number] DOB: Jun 28, 1950    ADMISSION DATE:  29-Mar-2015  CHIEF COMPLAINT:  Cardiac arrest  HISTORY OF PRESENT ILLNESS:   64 CM with only history of heart attack 30 years ago.  He had not been to a physician in many years so no other known medical history.  Per report the patient told his friend that he was worried he had a "heart attack or stroke" because he was having weakness in his right arm over the last few days.   [id number] today he choked on a piece of steak, and became unresponsive.  His girlfriend administered the heimlich maneuver     Witnessed PEA , asystole to rosc 30   SUBJECTIVE: at goal temp, some twitching noted   VITAL SIGNS: Temp:  [91 F (32.8 C)-98 F (36.7 C)] 91.8 F (33.2 C) (01/29 1000) Pulse Rate:  [55-99] 55 (01/29 1000) Resp:  [17-32] 30 (01/29 1000) BP: (87-168)/(50-88) 120/71 mmHg (01/29 0722) SpO2:  [90 %-100 %] 100 % (01/29 1000) Arterial Line BP: (106-168)/(59-87) 125/75 mmHg (01/29 1000) FiO2 (%):  [40 %-100 %] 80 % (01/29 0800) Weight:  [77.111 kg (170 lb)] 77.111 kg (170 lb) (01/28 2044) HEMODYNAMICS: CVP:  [9 mmHg-13 mmHg] 13 mmHg VENTILATOR SETTINGS: Vent Mode:  [-] PRVC FiO2 (%):  [40 %-100 %] 80 % Set Rate:  [24 bmp-30 bmp] 30 bmp Vt Set:  [620 mL] 620 mL PEEP:  [5 cmH20] 5 cmH20 Plateau Pressure:  [20 cmH20-23 cmH20] 22 cmH20 INTAKE / OUTPUT:  Intake/Output Summary (Last 24 hours) at 03/13/2015 1010 Last data filed at 03/16/2015 1000  Gross per 24 hour  Intake 3589.36 ml  Output    900 ml  Net 2689.36 ml    PHYSICAL EXAMINATION: General:  Sedated and paralysis Neuro:   Per sluggish HEENT:  NCAT,  Pupils 4mm fixed Cardiovascular:  RRR, no m/r/g.   Lungs:  ronchi Abdomen:  Soft non distended.  No masses. No bowel sounds Musculoskeletal:  + Flail chest Skin:  No abrasions no rashes  LABS:  CBC  Recent Labs Lab 03/24/2015 2042 03/22/2015 2059 03/10/2015 0020  03/12/2015 0440  WBC 14.8*  --  6.3 3.5*  HGB 15.2 16.3 15.2 15.5  HCT 46.1 48.0 44.8 45.2  PLT 176  --  169 145*   Coag's  Recent Labs Lab 03/26/2015 2042 03/11/2015 0440  APTT 53* 30  INR 1.63* 1.58*   BMET  Recent Labs Lab 03/21/2015 0320 03/22/2015 0440 03/03/2015 0620  NA 142 138 140  K 3.9 4.1 3.8  CL 112* 111 112*  CO2 18* 17* 17*  BUN 19 21* 21*  CREATININE 1.30* 1.39* 1.32*  GLUCOSE 185* 191* 172*   Electrolytes  Recent Labs Lab 03/15/2015 0240 03/02/2015 0320 03/27/2015 0440 03/12/2015 0620  CALCIUM 7.4* 7.3* 7.4* 7.6*  MG 1.5* 1.4*  --  2.9*  PHOS 4.5 3.9  --  2.5   Sepsis Markers  Recent Labs Lab 03/10/2015 2059 03/20/2015 0020 03/05/2015 0440  LATICACIDVEN 10.10* 6.4* 2.5*   ABG  Recent Labs Lab 03/04/2015 2049 03/25/2015 0029 03/09/2015 0224  PHART 6.931* 7.090* 7.153*  PCO2ART 67.4* 36.2 32.2*  PO2ART 404.0* 73.0* 71.0*   Liver Enzymes  Recent Labs Lab 03/14/2015 2042  AST 307*  ALT 340*  ALKPHOS 71  BILITOT 0.7  ALBUMIN 3.1*   Cardiac Enzymes  Recent Labs Lab 03/01/2015 0020 03/30/2015 0320  TROPONINI 1.49* 2.94*   Glucose  Recent  Labs Lab 03/02/2015 0032 03/19/2015 0113 03/20/2015 0155 03/07/2015 0322 03/10/2015 0403 03/30/2015 0747  GLUCAP 171* 166* 152* 160* 168* 156*    Imaging Ct Head Wo Contrast  04-24-15  CLINICAL DATA:  Patient choked on a piece of steak in went unresponsive. CPR. Patient now intubated. EXAM: CT HEAD WITHOUT CONTRAST TECHNIQUE: Contiguous axial images were obtained from the base of the skull through the vertex without intravenous contrast. COMPARISON:  None. FINDINGS: Ventricles and sulci appear symmetrical. No ventricular dilatation. No mass effect or midline shift. No abnormal extra-axial fluid collections. Gray-white matter junctions are distinct. Basal cisterns are not effaced. No evidence of acute intracranial hemorrhage. No depressed skull fractures. Opacification and air-fluid levels throughout the paranasal sinuses  bilaterally, including bilateral frontal, ethmoid, maxillary, and sphenoid sinuses. Irregularity of the medial right orbital wall suggesting an old fracture deformity. Bilateral optic drusen. Mastoid air cells are not opacified. IMPRESSION: No acute intracranial abnormalities. Fluid and air-fluid levels throughout the paranasal sinuses. Electronically Signed   By: Burman Nieves M.D.   On: Apr 24, 2015 23:37   Ct Chest Wo Contrast  04/24/15  CLINICAL DATA:  Choked on piece of steak. Became unresponsive. Status post CPR. Initial encounter. EXAM: CT CHEST WITHOUT CONTRAST TECHNIQUE: Multidetector CT imaging of the chest was performed following the standard protocol without IV contrast. COMPARISON:  Chest radiograph performed earlier today at 10:57 p.m. FINDINGS: Trace bilateral pleural effusions are noted. Underlying patchy bilateral central airspace opacities raise concern for pulmonary edema, though pneumonia cannot be entirely excluded. No pneumothorax is identified. No definite masses are seen. Diffuse coronary artery calcifications are noted. Venous hemorrhage is noted along the anterior aspect of the mediastinum, tracking to the level of the diaphragm. No pericardial effusion is seen. No mediastinal lymphadenopathy is appreciated. Scattered calcification is noted along the aortic arch and proximal great vessels. A left IJ line is noted ending about the proximal SVC. The patient's endotracheal tube is noted ending 1 cm above the carina. The endotracheal tube balloon appears overinflated. Fluid is noted within the trachea above the endotracheal tube balloon. The enteric tube is seen extending below the diaphragm into the stomach. The visualized portions of thyroid gland are unremarkable. No axillary lymphadenopathy is seen. The visualized portions of the liver and spleen are grossly unremarkable in appearance. There appears to be a minimally displaced fracture involving the body of the sternum. No additional  fractures are seen. Mild degenerative change is noted along the lower cervical spine. IMPRESSION: 1. Minimally displaced fracture involving the body of the sternum. 2. Venous hemorrhage along the anterior aspect of the mediastinum, tracking to the level of the diaphragm. 3. Trace bilateral pleural effusions noted. Underlying patchy bilateral central airspace opacities raise concern for pulmonary edema, though pneumonia cannot be entirely excluded. 4. Diffuse coronary artery calcifications seen. 5. Endotracheal tube noted ending 1 cm above the carina. The endotracheal tube balloon appears overinflated. Fluid seen in the trachea above the endotracheal tube balloon. This should be deflated slightly and retracted 1-2 cm. These results were called by telephone at the time of interpretation on 2015/04/24 at 11:42 pm to [facility] on [facility], who verbally acknowledged these results. Electronically Signed   By: Roanna Raider M.D.   On: Apr 24, 2015 23:46   Dg Chest Port 1 View  04/24/2015  CLINICAL DATA:  Cardiorespiratory arrest. Intubation. Repositioning of OG tube. Central venous catheter placement. EXAM: PORTABLE CHEST 1 VIEW 2257 hr: COMPARISON:  Portable chest x-Kashawn earlier same day 2058 hr. FINDINGS: Suboptimal inspiration.  Cardiac silhouette upper normal in size to slightly enlarged for technique and degree of inspiration. Interval development of mild interstitial and airspace opacities in both lungs, right greater than left, since the earlier examination. Endotracheal tube tip projects approximately 2 cm above the carina. Left subclavian central venous catheter tip projects over the mid SVC. No evidence of pneumothorax or mediastinal hematoma. OG tube tip in the fundus of the stomach. External pacing pads. IMPRESSION: 1. Support apparatus satisfactory. New left subclavian central venous catheter tip projects over the mid SVC. No acute complicating features. OG tube tip now in the fundus of the stomach. 2. Developing  mild CHF and/or fluid overload. Electronically Signed   By: Hulan Saas M.D.   On: 03/21/2015 23:15   Dg Chest Port 1 View  03/27/2015  CLINICAL DATA:  Endotracheal tube placement. Patient choked on a piece of steak and became unresponsive. EXAM: PORTABLE CHEST 1 VIEW COMPARISON:  None. FINDINGS: Endotracheal tube placed with tip measuring 2.9 cm above the carina. There is an enteric tube present. The tip is in the region of the upper esophagus at the level of the clavicles. Advancement is suggested. Shallow inspiration. Heart size and pulmonary vascularity are normal for technique. No focal consolidation. No pneumothorax. IMPRESSION: Endotracheal tube appears in satisfactory position. Enteric tube tip is in the upper esophagus at the level of clavicles and needs to be advanced. Electronically Signed   By: Burman Nieves M.D.   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  PULMONARY A: Acute resp failure, traumatic cpr with flail likely P:   - ABg reviewed, avoiding high peep with flail as able -peep to 8 and re eavluate to get to goal 70% -max rate to remain, no increase -pcxr in am  -NO ROLE BICARB!!!  CARDIOVASCULAR A: S/p out of hospital Vfib arrest with approx of total CPR time. P:  - therapeutic hypothermia protocol 33 deg- will discuss with family to dc this, wil not change outcome - dc all trop - levo , no further will change outcome -cvp at goal  RENAL A:  AGMA 2/2 elevated lactate from cardiac arrest. Unclear baseline Scr P:   -lactic resolved,peak indicates poor outcome -Chem per protocol  -NO ROLE BICARB for la  GASTROINTESTINAL A:  Transamonitis, likely early shock liver P:   - trend LFT - APAP LEVEL neg - ASA LEVEL neg - TOX SCREEN - Etoh level 37 -no tf  HEMATOLOGIC A:  Leukocytosis likely stress resoponse P:  -sub q  hep  INFECTIOUS A:  No infection noted P:   BCx2 03/18/2015 Follow cooling needs  ENDOCRINE A:  Hyperglycemia without dx of DM   P:   - SSI  NEUROLOGIC A:  Diffuse anoxic brain injury with of total down time s/p 3 arrests.  Catastrophic neuro exam at present time Poor outcome highly likely P:   RASS goal: -5, paralyzed Deep sedation for hypothermic protocol started Lactic 10, rhythm , rosc time, exam in admission ( although not favor to use) when combined = poor prognosis, will dicuss with family to dc all TTM for comfort Would favor versed over prop with shock eeg   FAMILY  - Updates: Patient has girlfriend (Cici) with whom he lives.  He is also still married to [place] [phone]).  The family dynamic is stable and functional.  I  have spoken with both women, his wife will remain MDPOA at this time (Cici is in agreement with this).  I have updated the family and his wife is on the way.  Cici is at bedside.   Ccm time 30 min   Mcarthur Rossetti. Tyson Alias, MD, FACP Pgr: [phone] [facility]

## 2015-04-01 NOTE — Progress Notes (Signed)
CRITICAL VALUE ALERT  Critical value received:  Lactic Acid 6.4  Date of notification:  04/03/2015  Time of notification:  0200  Critical value read back:yes  Nurse who received alert:  Marshia Ly  MD notified (1st page):  E-link MD  Time of first page:  0208  Responding MD:  E-link MD  Time MD responded:  0208  Also notified of mag 1.7 and need for insulin coverage. Orders to be placed. Will continue to monitor.

## 2015-04-01 NOTE — Progress Notes (Signed)
Dr. Loney Loh paged regarding troponin 2.94.

## 2015-04-01 NOTE — Progress Notes (Signed)
Checked in to see how family was doing. Wife was present and at bedside with girlfriend. Both were doing as well as could be expected, just waiting to see what the next 24-48 hours would bring.

## 2015-04-01 NOTE — Progress Notes (Signed)
Called medical examiner and was instructed to remove foley and leave all lines and catheters in place at this time.

## 2015-04-01 NOTE — Progress Notes (Signed)
Patient ID: Jimmy Meyers, male   DOB: 1950-05-15, 65 y.o.   MRN: [id number]  Extensive discussion with family med Melina Modena and girlfriend. We discussed the poor prognosis and likely poor quality of life. Family has decided to offer full comfort care. They are aware that the patient may be transferred to palliative care floor for continued comfort care needs. They have been fully updated on the process and expectations.  He has MODS, prolonged down time , poor rhythm, lactic, he simply nevber wanted this and prognosis is poor  Mcarthur Rossetti. Tyson Alias, MD, FACP Pgr: [phone] [facility]

## 2015-04-01 NOTE — Procedures (Signed)
Extubation Procedure Note  Patient Details:   Name: Jimmy Meyers DOB: 1950-06-06 MRN: [id number]   Airway Documentation:     Evaluation  O2 sats: currently acceptable Complications: No apparent complications Patient did tolerate procedure well. Bilateral Breath Sounds: Clear, Diminished Suctioning: Airway No   Terminal withdrawal.  Rayburn Felt 03/21/2015, 4:49 PM

## 2015-04-01 NOTE — Progress Notes (Signed)
CRITICAL VALUE ALERT  Critical value received: Lactic Acid 2.5 Date of notification:  03/07/2015 Time of notification:  0600 Critical value read back:yes Nurse who received alert: Marshia Ly MD notified (1st page): E-link MD  Time of first page:  [phone]

## 2015-04-01 NOTE — Procedures (Signed)
Arterial Catheter Insertion Procedure Note Elmar Antigua [id number] 04-Jan-1951  Procedure: Insertion of Arterial Catheter  Indications: Blood pressure monitoring  Procedure Details Consent: Unable to obtain consent because of altered level of consciousness. Time Out: Verified patient identification, verified procedure, site/side was marked, verified correct patient position, special equipment/implants available, medications/allergies/relevent history reviewed, required imaging and test results available.  Performed  Maximum sterile technique was used including antiseptics, cap, gloves, gown, hand hygiene, mask and sheet. Skin prep: Chlorhexidine; local anesthetic administered 20 gauge catheter was inserted into left radial artery using the Seldinger technique.  Evaluation Blood flow good; BP tracing good. Complications: No apparent complications.   Augustine Radar 04/08/15

## 2015-04-01 NOTE — Progress Notes (Signed)
Extensive discussion with wife and girlfriend with Dr Tyson Alias and myself discussing prognosis, plan of care. Family feels strongly that patient would "be angry with them" if he saw current level of care/intervention. Family states wish to pursue comfort care and withdraw life-sustaining measures. Care team explained importance of rewarming patient to safe temperature and stopping paralytic and sedative medications, and letting those metabolize out of his system, prior to moving to full comfort care. Discussed that this would be at least 6 hours, and dependent on patient's response to weaning of medications and rewarming. Family expresses full understanding.

## 2015-04-01 DEATH — deceased

## 2015-04-03 LAB — CULTURE, BLOOD (ROUTINE X 2): Culture: NO GROWTH

## 2015-04-09 LAB — CULTURE, BLOOD (ROUTINE X 2)

## 2015-04-29 NOTE — Discharge Summary (Signed)
NAMEDAXTON, NYDAM NO.:  [id number]  MEDICAL RECORD NO.:  [id number]  LOCATION:  [id number]                        FACILITY:  [facility]  PHYSICIAN:  Raylene Miyamoto, MD DATE OF BIRTH:  1950-05-11  DATE OF ADMISSION:  03/22/2015 DATE OF DISCHARGE:  04/06/2015                              DISCHARGE SUMMARY   DEATH SUMMARY:  This is a 65 year old male, who has a limited past medical history, so he has not seen doctors often, but 30 years ago, supposedly had a heart attack.  Essentially, he was found at home, had choked on a piece of steak and became unresponsive, found in cardiac arrest, Heimlich maneuver  was attempted.  The patient was in PEA, upon arrival of the EMS, and asystole with return of spontaneous circulation greater than 30 minutes.  The patient on lactic acid, greater than 10, myoclonic movements upon assessment in emergency room, and was opted at that time to have temperature management provided.  Temperature management was provided, was taken to the CCU and I met with the patient's wife, although separated for over 10 years and the patient's current girlfriend with a very good relationship.  They told me that he would never want to be continued on these circumstances.  It was clear that his neurologic outcome was going to be horrible.  He had a flail chest in addition on physical examination, from CPR and elevated lactic acid way above 6.5, with acute kidney injury.  Also acute liver dysfunction secondary to shock liver.  It was decided clearly to de- paralyze him and to limit sedation and provide comfort care.  We took our time over 6-7 hours to have family visit and of course discontinued all paralytics and all sedation and as noted a poor neurologic status at that time and provided comfort care and the patient expired.  FINAL DIAGNOSES:  Upon death; 1. Asphyxiation from choking on steak. 2. Cardiac arrest, secondary to #1. 3. Acute respiratory  failure secondary choking on steak and cardiac     arrest. 4. Shock liver. 5. Leukopenia. 6. Severe anoxic brain injury with myoclonus.     Raylene Miyamoto, MD     DJF/MEDQ  D:  03/31/2015  T:  03/31/2015  Job:  [id number]

## 2016-06-25 IMAGING — CT CT HEAD W/O CM
2 series · 15 of 30 positions shown, 17 images · non-contrast
Comparison: None.

CLINICAL DATA: Patient choked on a piece of steak in went
unresponsive. CPR. Patient now intubated.

EXAM:
CT HEAD WITHOUT CONTRAST
TECHNIQUE: Contiguous axial images were obtained from the base of the skull
through the vertex without intravenous contrast.

[Series 503: head without · axial · non-contrast · 0.45mm/px · z∈[+537,+657]mm · 7 of 33 slices shown, 9 images]
[im 5/33  brain]
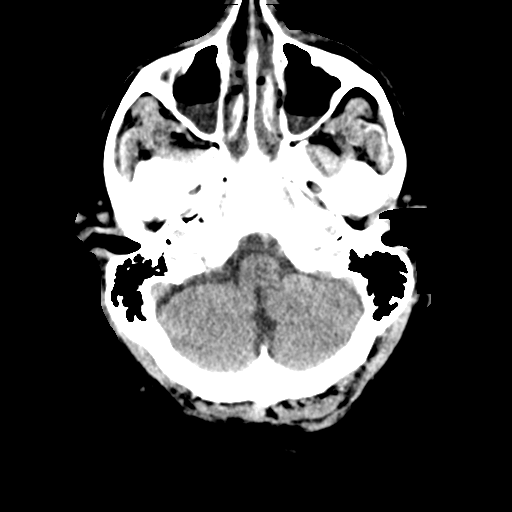
[im 5/33  bone]
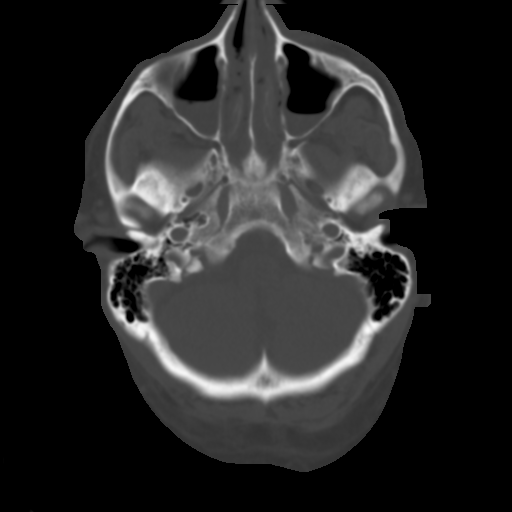
[im 9/33  brain]
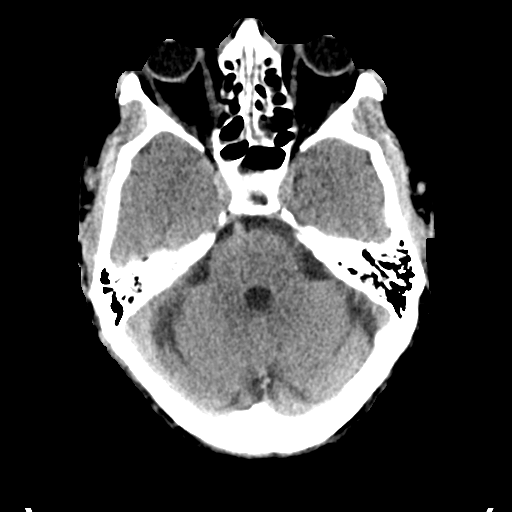
[im 13/33  brain]
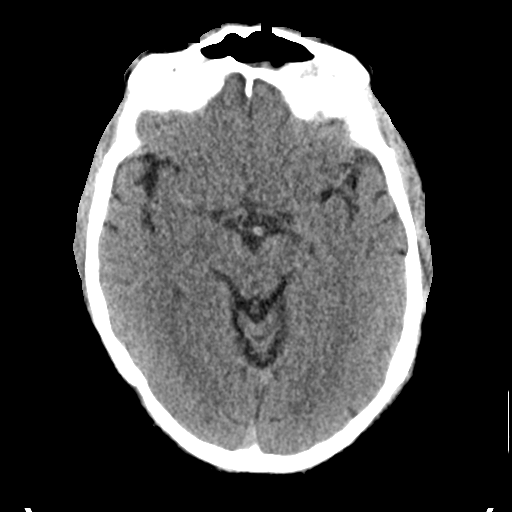
[im 17/33  brain]
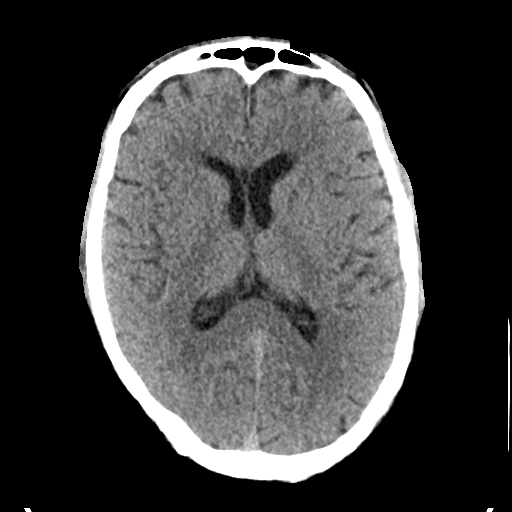
[im 21/33  brain]
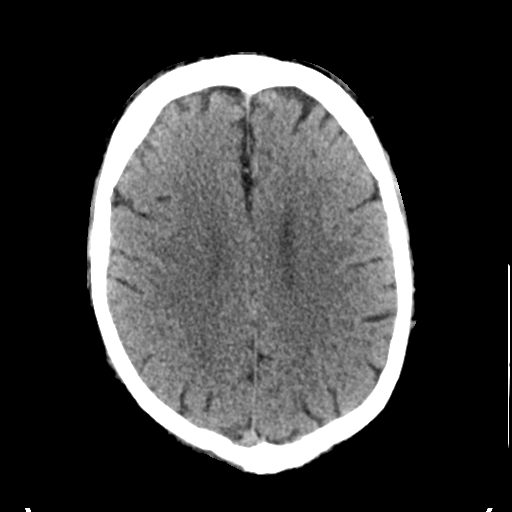
[im 21/33  bone]
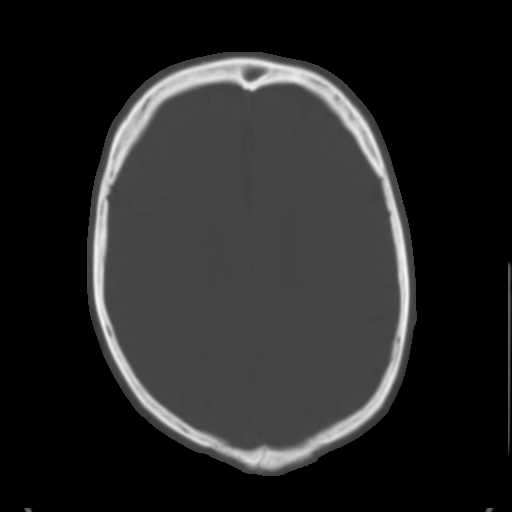
[im 25/33  brain]
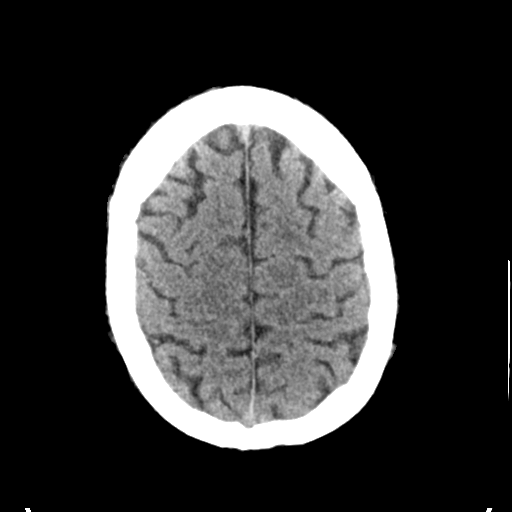
[im 29/33  brain]
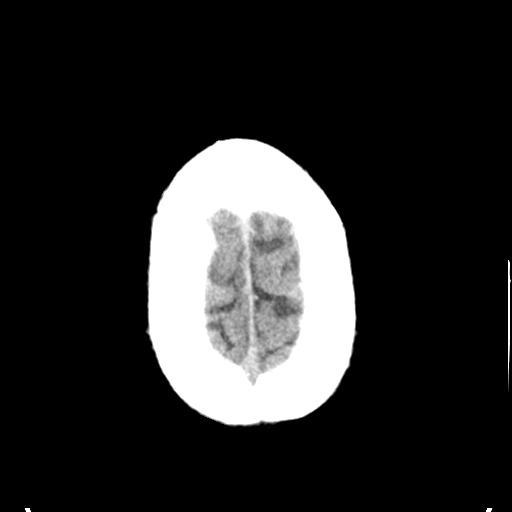

[Series 504: head bone · axial · 0.45mm/px · z∈[+533,+663]mm · 8 of 83 slices shown]
[im 9/83  bone]
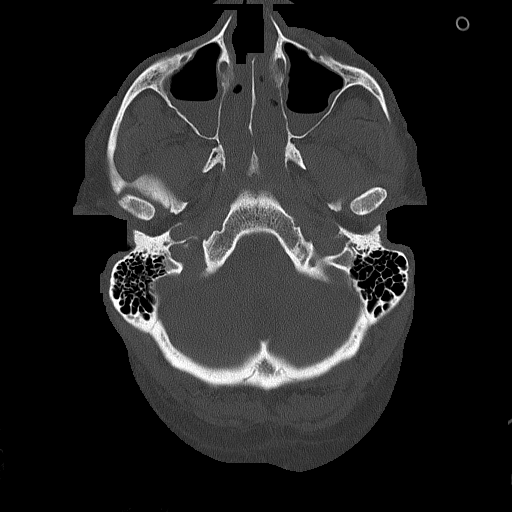
[im 17/83  bone]
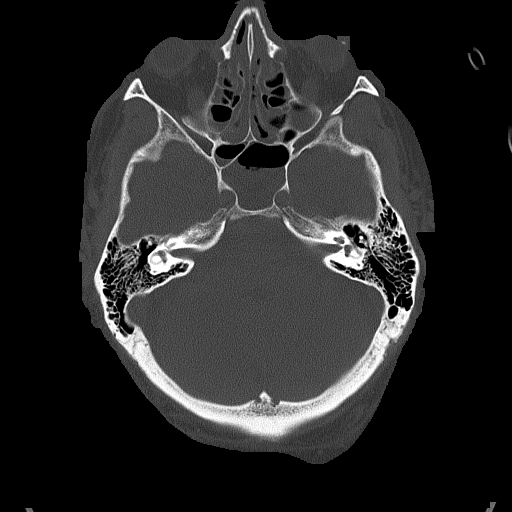
[im 25/83  bone]
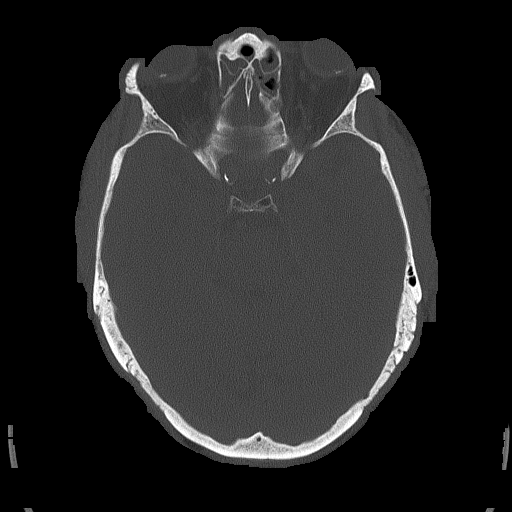
[im 37/83  bone]
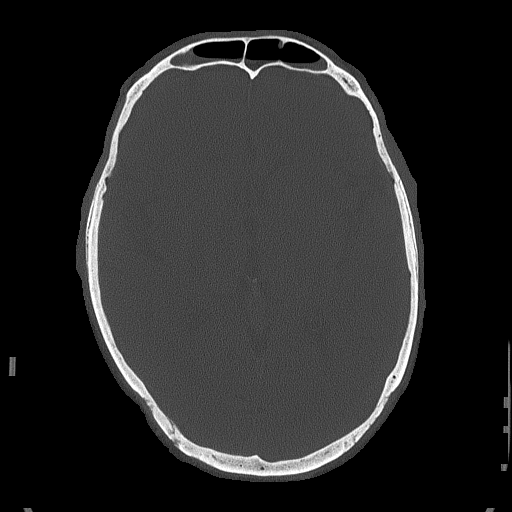
[im 46/83  bone]
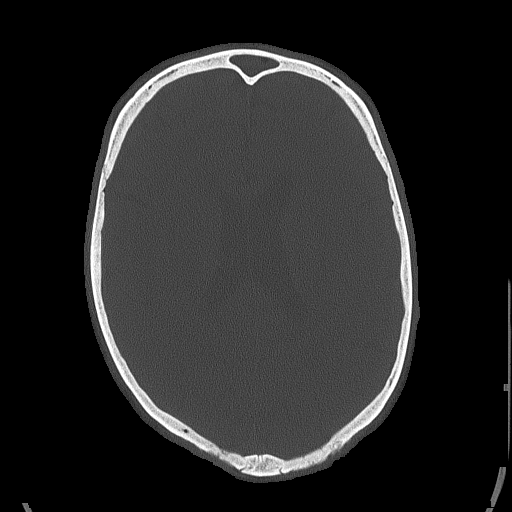
[im 58/83  bone]
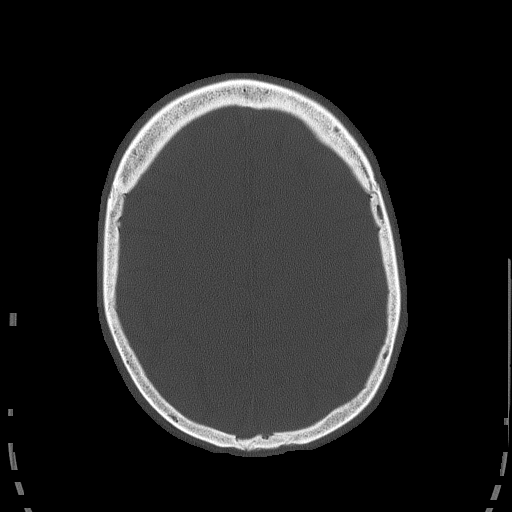
[im 66/83  bone]
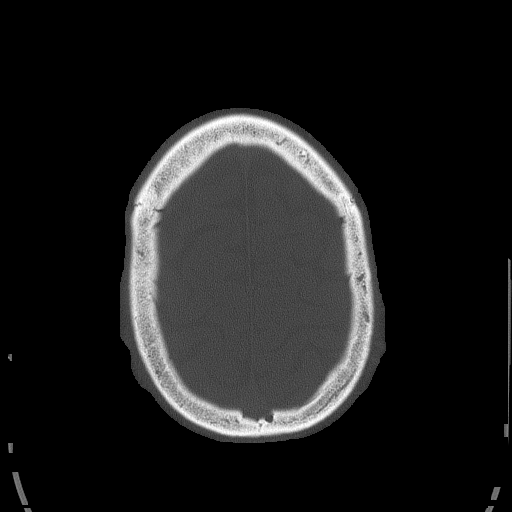
[im 74/83  bone]
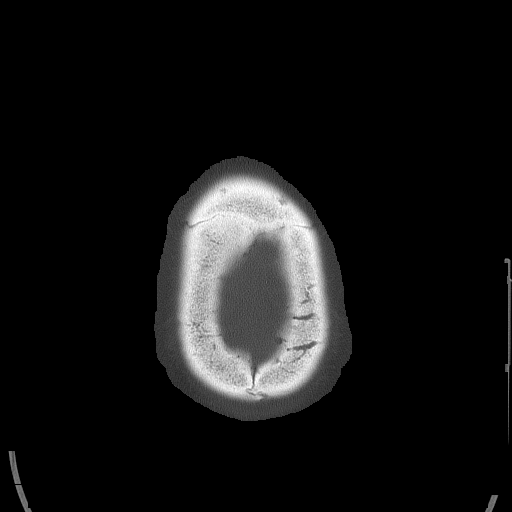

[15 of 30 positions shown; findings below may reference images not displayed]

FINDINGS: Ventricles and sulci appear symmetrical. No ventricular dilatation.
No mass effect or midline shift. No abnormal extra-axial fluid
collections. Gray-white matter junctions are distinct. Basal
cisterns are not effaced. No evidence of acute intracranial
hemorrhage. No depressed skull fractures. Opacification and
air-fluid levels throughout the paranasal sinuses bilaterally,
including bilateral frontal, ethmoid, maxillary, and sphenoid
sinuses. Irregularity of the medial right orbital wall suggesting an
old fracture deformity. Bilateral optic drusen. Mastoid air cells
are not opacified.
IMPRESSION: No acute intracranial abnormalities. Fluid and air-fluid levels
throughout the paranasal sinuses.
